# Patient Record
Sex: Male | Born: 1980 | Race: Black or African American | Hispanic: No | Smoking: Never smoker
Health system: Southern US, Community
[De-identification: ages and names within clinical notes are randomized; demographics above are authoritative.]

## PROBLEM LIST (undated history)

## (undated) DIAGNOSIS — N200 Calculus of kidney: Secondary | ICD-10-CM

---
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
## 1998-02-28 ENCOUNTER — Ambulatory Visit (HOSPITAL_COMMUNITY): Admission: RE | Admit: 1998-02-28 | Discharge: 1998-02-28 | Payer: Self-pay | Admitting: Nephrology

## 1998-02-28 ENCOUNTER — Encounter: Payer: Self-pay | Admitting: Nephrology

## 1998-05-06 ENCOUNTER — Encounter: Admission: RE | Admit: 1998-05-06 | Discharge: 1998-06-02 | Payer: Self-pay | Admitting: Nephrology

## 1998-09-08 ENCOUNTER — Ambulatory Visit (HOSPITAL_COMMUNITY): Admission: RE | Admit: 1998-09-08 | Discharge: 1998-09-08 | Payer: Self-pay | Admitting: Nephrology

## 1998-09-08 ENCOUNTER — Encounter: Payer: Self-pay | Admitting: Nephrology

## 1998-09-27 ENCOUNTER — Ambulatory Visit (HOSPITAL_COMMUNITY): Admission: RE | Admit: 1998-09-27 | Discharge: 1998-09-27 | Payer: Self-pay | Admitting: Neurosurgery

## 1999-02-11 ENCOUNTER — Emergency Department (HOSPITAL_COMMUNITY): Admission: EM | Admit: 1999-02-11 | Discharge: 1999-02-11 | Payer: Self-pay | Admitting: Emergency Medicine

## 1999-02-11 ENCOUNTER — Encounter: Payer: Self-pay | Admitting: Emergency Medicine

## 1999-07-04 ENCOUNTER — Encounter: Admission: RE | Admit: 1999-07-04 | Discharge: 1999-07-04 | Payer: Self-pay | Admitting: Nephrology

## 1999-07-04 ENCOUNTER — Encounter: Payer: Self-pay | Admitting: Nephrology

## 1999-08-30 ENCOUNTER — Emergency Department (HOSPITAL_COMMUNITY): Admission: EM | Admit: 1999-08-30 | Discharge: 1999-08-30 | Payer: Self-pay | Admitting: Emergency Medicine

## 1999-12-11 ENCOUNTER — Emergency Department (HOSPITAL_COMMUNITY): Admission: EM | Admit: 1999-12-11 | Discharge: 1999-12-11 | Payer: Self-pay | Admitting: *Deleted

## 2000-01-26 ENCOUNTER — Emergency Department (HOSPITAL_COMMUNITY): Admission: EM | Admit: 2000-01-26 | Discharge: 2000-01-26 | Payer: Self-pay | Admitting: Emergency Medicine

## 2000-01-26 ENCOUNTER — Encounter: Payer: Self-pay | Admitting: Emergency Medicine

## 2000-03-25 ENCOUNTER — Encounter: Payer: Self-pay | Admitting: Nephrology

## 2000-03-25 ENCOUNTER — Encounter: Admission: RE | Admit: 2000-03-25 | Discharge: 2000-03-25 | Payer: Self-pay | Admitting: Nephrology

## 2000-04-26 ENCOUNTER — Ambulatory Visit (HOSPITAL_COMMUNITY): Admission: RE | Admit: 2000-04-26 | Discharge: 2000-04-26 | Payer: Self-pay | Admitting: Neurosurgery

## 2000-07-31 ENCOUNTER — Emergency Department (HOSPITAL_COMMUNITY): Admission: EM | Admit: 2000-07-31 | Discharge: 2000-07-31 | Payer: Self-pay | Admitting: Emergency Medicine

## 2000-07-31 ENCOUNTER — Encounter: Payer: Self-pay | Admitting: Emergency Medicine

## 2000-08-21 ENCOUNTER — Ambulatory Visit (HOSPITAL_COMMUNITY): Admission: RE | Admit: 2000-08-21 | Discharge: 2000-08-21 | Payer: Self-pay | Admitting: Neurosurgery

## 2000-09-12 ENCOUNTER — Encounter: Payer: Self-pay | Admitting: Emergency Medicine

## 2000-09-12 ENCOUNTER — Emergency Department (HOSPITAL_COMMUNITY): Admission: EM | Admit: 2000-09-12 | Discharge: 2000-09-12 | Payer: Self-pay | Admitting: Emergency Medicine

## 2000-09-24 ENCOUNTER — Emergency Department (HOSPITAL_COMMUNITY): Admission: EM | Admit: 2000-09-24 | Discharge: 2000-09-24 | Payer: Self-pay | Admitting: Emergency Medicine

## 2000-09-24 ENCOUNTER — Encounter: Payer: Self-pay | Admitting: Emergency Medicine

## 2000-10-09 ENCOUNTER — Emergency Department (HOSPITAL_COMMUNITY): Admission: EM | Admit: 2000-10-09 | Discharge: 2000-10-09 | Payer: Self-pay | Admitting: Emergency Medicine

## 2000-10-12 ENCOUNTER — Encounter: Payer: Self-pay | Admitting: Emergency Medicine

## 2000-10-12 ENCOUNTER — Emergency Department (HOSPITAL_COMMUNITY): Admission: EM | Admit: 2000-10-12 | Discharge: 2000-10-12 | Payer: Self-pay | Admitting: Emergency Medicine

## 2000-10-28 ENCOUNTER — Encounter: Admission: RE | Admit: 2000-10-28 | Discharge: 2000-10-28 | Payer: Self-pay | Admitting: Nephrology

## 2000-10-28 ENCOUNTER — Encounter: Payer: Self-pay | Admitting: Nephrology

## 2001-03-11 ENCOUNTER — Emergency Department (HOSPITAL_COMMUNITY): Admission: EM | Admit: 2001-03-11 | Discharge: 2001-03-11 | Payer: Self-pay | Admitting: Emergency Medicine

## 2001-03-11 ENCOUNTER — Encounter: Payer: Self-pay | Admitting: Emergency Medicine

## 2001-03-12 ENCOUNTER — Emergency Department (HOSPITAL_COMMUNITY): Admission: EM | Admit: 2001-03-12 | Discharge: 2001-03-12 | Payer: Self-pay | Admitting: Emergency Medicine

## 2001-03-14 ENCOUNTER — Emergency Department (HOSPITAL_COMMUNITY): Admission: EM | Admit: 2001-03-14 | Discharge: 2001-03-14 | Payer: Self-pay | Admitting: Emergency Medicine

## 2001-03-14 ENCOUNTER — Encounter: Payer: Self-pay | Admitting: Emergency Medicine

## 2001-03-19 ENCOUNTER — Emergency Department (HOSPITAL_COMMUNITY): Admission: EM | Admit: 2001-03-19 | Discharge: 2001-03-19 | Payer: Self-pay | Admitting: Emergency Medicine

## 2001-03-19 ENCOUNTER — Encounter: Payer: Self-pay | Admitting: Emergency Medicine

## 2001-04-05 ENCOUNTER — Emergency Department (HOSPITAL_COMMUNITY): Admission: EM | Admit: 2001-04-05 | Discharge: 2001-04-05 | Payer: Self-pay | Admitting: Emergency Medicine

## 2002-11-21 ENCOUNTER — Emergency Department (HOSPITAL_COMMUNITY): Admission: EM | Admit: 2002-11-21 | Discharge: 2002-11-21 | Payer: Self-pay | Admitting: Emergency Medicine

## 2004-09-22 ENCOUNTER — Encounter: Admission: RE | Admit: 2004-09-22 | Discharge: 2004-09-22 | Payer: Self-pay | Admitting: Internal Medicine

## 2004-12-19 ENCOUNTER — Encounter: Admission: RE | Admit: 2004-12-19 | Discharge: 2004-12-19 | Payer: Self-pay | Admitting: Internal Medicine

## 2005-03-27 ENCOUNTER — Emergency Department (HOSPITAL_COMMUNITY): Admission: EM | Admit: 2005-03-27 | Discharge: 2005-03-27 | Payer: Self-pay | Admitting: Emergency Medicine

## 2006-06-22 ENCOUNTER — Emergency Department (HOSPITAL_COMMUNITY): Admission: EM | Admit: 2006-06-22 | Discharge: 2006-06-22 | Payer: Self-pay | Admitting: Emergency Medicine

## 2007-11-20 ENCOUNTER — Emergency Department (HOSPITAL_COMMUNITY): Admission: EM | Admit: 2007-11-20 | Discharge: 2007-11-20 | Payer: Self-pay | Admitting: Family Medicine

## 2007-12-05 ENCOUNTER — Encounter: Admission: RE | Admit: 2007-12-05 | Discharge: 2007-12-05 | Payer: Self-pay | Admitting: Internal Medicine

## 2008-04-14 ENCOUNTER — Ambulatory Visit: Payer: Self-pay | Admitting: Pulmonary Disease

## 2008-04-14 DIAGNOSIS — R05 Cough: Secondary | ICD-10-CM

## 2008-04-14 DIAGNOSIS — R519 Headache, unspecified: Secondary | ICD-10-CM | POA: Insufficient documentation

## 2008-04-14 DIAGNOSIS — R059 Cough, unspecified: Secondary | ICD-10-CM | POA: Insufficient documentation

## 2008-04-14 DIAGNOSIS — R51 Headache: Secondary | ICD-10-CM

## 2008-04-14 DIAGNOSIS — J309 Allergic rhinitis, unspecified: Secondary | ICD-10-CM | POA: Insufficient documentation

## 2008-07-19 ENCOUNTER — Emergency Department (HOSPITAL_COMMUNITY): Admission: EM | Admit: 2008-07-19 | Discharge: 2008-07-19 | Payer: Self-pay | Admitting: Emergency Medicine

## 2008-09-06 ENCOUNTER — Emergency Department (HOSPITAL_COMMUNITY): Admission: EM | Admit: 2008-09-06 | Discharge: 2008-09-06 | Payer: Self-pay | Admitting: Emergency Medicine

## 2009-08-16 ENCOUNTER — Emergency Department (HOSPITAL_COMMUNITY): Admission: EM | Admit: 2009-08-16 | Discharge: 2009-08-16 | Payer: Self-pay | Admitting: Internal Medicine

## 2010-03-15 ENCOUNTER — Emergency Department (HOSPITAL_COMMUNITY)
Admission: EM | Admit: 2010-03-15 | Discharge: 2010-03-15 | Payer: Self-pay | Source: Home / Self Care | Admitting: Family Medicine

## 2010-04-10 ENCOUNTER — Encounter: Payer: Self-pay | Admitting: Internal Medicine

## 2010-04-12 ENCOUNTER — Emergency Department (HOSPITAL_COMMUNITY)
Admission: EM | Admit: 2010-04-12 | Discharge: 2010-04-12 | Payer: Self-pay | Source: Home / Self Care | Admitting: Emergency Medicine

## 2010-04-12 LAB — URINE MICROSCOPIC-ADD ON

## 2010-04-12 LAB — URINALYSIS, ROUTINE W REFLEX MICROSCOPIC
Bilirubin Urine: NEGATIVE
Ketones, ur: NEGATIVE mg/dL
Protein, ur: 30 mg/dL — AB
Urine Glucose, Fasting: NEGATIVE mg/dL
pH: 6 (ref 5.0–8.0)

## 2010-05-29 LAB — POCT I-STAT, CHEM 8
BUN: 15 mg/dL (ref 6–23)
Calcium, Ion: 1.12 mmol/L (ref 1.12–1.32)
Chloride: 105 mEq/L (ref 96–112)
Glucose, Bld: 95 mg/dL (ref 70–99)
HCT: 49 % (ref 39.0–52.0)
Hemoglobin: 16.7 g/dL (ref 13.0–17.0)
Potassium: 4.3 mEq/L (ref 3.5–5.1)

## 2010-06-05 LAB — URINALYSIS, ROUTINE W REFLEX MICROSCOPIC
Glucose, UA: NEGATIVE mg/dL
Ketones, ur: NEGATIVE mg/dL
Specific Gravity, Urine: 1.019 (ref 1.005–1.030)
pH: 5 (ref 5.0–8.0)

## 2010-06-05 LAB — POCT I-STAT, CHEM 8
Calcium, Ion: 1.08 mmol/L — ABNORMAL LOW (ref 1.12–1.32)
Chloride: 103 mEq/L (ref 96–112)
HCT: 48 % (ref 39.0–52.0)
Hemoglobin: 16.3 g/dL (ref 13.0–17.0)
Potassium: 4.2 mEq/L (ref 3.5–5.1)
Sodium: 137 mEq/L (ref 135–145)
TCO2: 27 mmol/L (ref 0–100)

## 2010-09-24 ENCOUNTER — Emergency Department (HOSPITAL_COMMUNITY)
Admission: EM | Admit: 2010-09-24 | Discharge: 2010-09-24 | Disposition: A | Discharge status: Home / Self Care | Payer: Self-pay | Attending: Emergency Medicine | Admitting: Emergency Medicine

## 2010-09-24 DIAGNOSIS — E860 Dehydration: Secondary | ICD-10-CM | POA: Insufficient documentation

## 2010-09-24 DIAGNOSIS — R42 Dizziness and giddiness: Secondary | ICD-10-CM | POA: Insufficient documentation

## 2010-09-24 DIAGNOSIS — N23 Unspecified renal colic: Secondary | ICD-10-CM | POA: Insufficient documentation

## 2010-09-24 LAB — POCT I-STAT, CHEM 8
BUN: 8 mg/dL (ref 6–23)
Calcium, Ion: 1.16 mmol/L (ref 1.12–1.32)
Chloride: 104 meq/L (ref 96–112)
Creatinine, Ser: 1 mg/dL (ref 0.50–1.35)
Glucose, Bld: 86 mg/dL (ref 70–99)
HCT: 51 % (ref 39.0–52.0)
Hemoglobin: 17.3 g/dL — ABNORMAL HIGH (ref 13.0–17.0)
Potassium: 3.9 meq/L (ref 3.5–5.1)
Sodium: 141 meq/L (ref 135–145)
TCO2: 26 mmol/L (ref 0–100)

## 2010-09-24 LAB — URINE MICROSCOPIC-ADD ON

## 2010-09-24 LAB — URINALYSIS, ROUTINE W REFLEX MICROSCOPIC
Bilirubin Urine: NEGATIVE
Glucose, UA: NEGATIVE mg/dL
Ketones, ur: NEGATIVE mg/dL
Leukocytes, UA: NEGATIVE
Nitrite: NEGATIVE
Protein, ur: NEGATIVE mg/dL
Specific Gravity, Urine: 1.027 (ref 1.005–1.030)
Urobilinogen, UA: 0.2 mg/dL (ref 0.0–1.0)
pH: 5.5 (ref 5.0–8.0)

## 2010-09-25 ENCOUNTER — Other Ambulatory Visit: Payer: Self-pay

## 2010-09-25 LAB — OCCULT BLOOD, POC DEVICE: Fecal Occult Bld: POSITIVE

## 2010-12-20 LAB — POCT URINALYSIS DIP (DEVICE)
Bilirubin Urine: NEGATIVE
Glucose, UA: NEGATIVE
Nitrite: NEGATIVE
Specific Gravity, Urine: 1.02
Urobilinogen, UA: 0.2

## 2011-05-13 ENCOUNTER — Emergency Department (HOSPITAL_COMMUNITY)
Admission: EM | Admit: 2011-05-13 | Discharge: 2011-05-13 | Disposition: A | Discharge status: Home Health | Payer: Self-pay | Attending: Emergency Medicine | Admitting: Emergency Medicine

## 2011-05-13 ENCOUNTER — Encounter (HOSPITAL_COMMUNITY): Payer: Self-pay | Admitting: *Deleted

## 2011-05-13 ENCOUNTER — Emergency Department (HOSPITAL_COMMUNITY): Payer: Self-pay

## 2011-05-13 DIAGNOSIS — M545 Low back pain, unspecified: Secondary | ICD-10-CM | POA: Insufficient documentation

## 2011-05-13 DIAGNOSIS — R109 Unspecified abdominal pain: Secondary | ICD-10-CM | POA: Insufficient documentation

## 2011-05-13 DIAGNOSIS — R1032 Left lower quadrant pain: Secondary | ICD-10-CM | POA: Insufficient documentation

## 2011-05-13 HISTORY — DX: Calculus of kidney: N20.0

## 2011-05-13 LAB — POCT I-STAT, CHEM 8
BUN: 13 mg/dL (ref 6–23)
Calcium, Ion: 1.16 mmol/L (ref 1.12–1.32)
Hemoglobin: 16 g/dL (ref 13.0–17.0)
Sodium: 141 mEq/L (ref 135–145)
TCO2: 26 mmol/L (ref 0–100)

## 2011-05-13 LAB — URINALYSIS, ROUTINE W REFLEX MICROSCOPIC
Bilirubin Urine: NEGATIVE
Glucose, UA: NEGATIVE mg/dL
Hgb urine dipstick: NEGATIVE
Ketones, ur: NEGATIVE mg/dL
Protein, ur: NEGATIVE mg/dL
Urobilinogen, UA: 0.2 mg/dL (ref 0.0–1.0)

## 2011-05-13 LAB — CBC
HCT: 44.4 % (ref 39.0–52.0)
MCH: 27.5 pg (ref 26.0–34.0)
MCHC: 34 g/dL (ref 30.0–36.0)
MCV: 80.7 fL (ref 78.0–100.0)
Platelets: 235 10*3/uL (ref 150–400)
RDW: 12.9 % (ref 11.5–15.5)

## 2011-05-13 LAB — DIFFERENTIAL
Basophils Absolute: 0 10*3/uL (ref 0.0–0.1)
Eosinophils Absolute: 0.1 10*3/uL (ref 0.0–0.7)
Eosinophils Relative: 2 % (ref 0–5)
Lymphocytes Relative: 48 % — ABNORMAL HIGH (ref 12–46)
Monocytes Absolute: 0.3 10*3/uL (ref 0.1–1.0)

## 2011-05-13 MED ORDER — KETOROLAC TROMETHAMINE 30 MG/ML IJ SOLN
30.0000 mg | Freq: Once | INTRAMUSCULAR | Status: AC
  Administered 2011-05-13: 30 mg via INTRAVENOUS
  Filled 2011-05-13: qty 1

## 2011-05-13 MED ORDER — HYDROCODONE-ACETAMINOPHEN 5-500 MG PO TABS: 1.0000 | ORAL_TABLET | Freq: Four times a day (QID) | ORAL | Status: AC | PRN

## 2011-05-13 NOTE — ED Notes (Signed)
Pt reports bilateral lower abd pain since yesterday, denies any n/v/d or urinary symptoms. No distress noted at triage.

## 2011-05-13 NOTE — ED Provider Notes (Signed)
History     CSN: [id number]  Arrival date & time 05/13/11  1615   First MD Initiated Contact with Patient 05/13/11 1856      Chief Complaint  Patient presents with  . Abdominal Pain    (Consider location/radiation/quality/duration/timing/severity/associated sxs/prior treatment) HPI Hx provided by the pt.  31 y.o. M with h/o kidney stones presenting with c/o abd pain.  Pt started gradually about 3 days ago and is described as constant, sharp, in bilateral lower abd but now primarily in the LLQ.  Occasional radiation to his lower back.  Mildly worse when prone; no appreciated alleviating factors.  Pain is a 5-6/10 in severity and is somewhat similar to his prior kidney stone pain but different 2/2 located bilaterally now.  No associated N/V/D, hematochezia, fever, or urinary c/o.  Pt has not been seen recently for these complaints.  All of pt's prior kidney stones have passed spontaneously and without intervention.    Past Medical History  Diagnosis Date  . Kidney stone     History reviewed. No pertinent past surgical history.  History reviewed. No pertinent family history.  History  Substance Use Topics  . Smoking status: Not on file  . Smokeless tobacco: Not on file  . Alcohol Use: No      Review of Systems  Constitutional: Negative for fever and chills.  HENT: Negative for congestion, sore throat and rhinorrhea.   Eyes: Negative for pain and visual disturbance.  Respiratory: Negative for cough and shortness of breath.   Cardiovascular: Negative for chest pain and palpitations.  Gastrointestinal: Positive for abdominal pain. Negative for nausea, vomiting and diarrhea.  Genitourinary: Negative for dysuria, hematuria, scrotal swelling and testicular pain.  Musculoskeletal: Negative for back pain and gait problem.  Skin: Negative for rash and wound.  Neurological: Negative for dizziness and headaches.  Psychiatric/Behavioral: Negative for confusion and agitation.    All other systems reviewed and are negative.    Allergies  Review of patient's allergies indicates no known allergies.  Home Medications  No current outpatient prescriptions on file.  BP 123/83  Pulse 82  Temp(Src) 98.1 F (36.7 C) (Oral)  Resp 18  SpO2 100%  Physical Exam  Nursing note and vitals reviewed. Constitutional: He is oriented to person, place, and time. He appears well-developed and well-nourished. No distress.  HENT:  Head: Normocephalic and atraumatic.  Right Ear: External ear normal.  Left Ear: External ear normal.  Nose: Nose normal.  Mouth/Throat: Oropharynx is clear and moist.  Eyes: Conjunctivae and EOM are normal. Pupils are equal, round, and reactive to light.  Neck: Normal range of motion. Neck supple.  Cardiovascular: Normal rate, regular rhythm and intact distal pulses.   No murmur heard. Pulmonary/Chest: Effort normal and breath sounds normal. No respiratory distress.  Abdominal: Soft. Bowel sounds are normal. He exhibits no distension. There is no tenderness. There is no rebound and no CVA tenderness.  Genitourinary: Prostate normal. Guaiac negative stool.       No penile d/c; no testicular pain or edema; equal/NL cremaster reflexes.  Musculoskeletal: Normal range of motion. He exhibits no edema.  Neurological: He is alert and oriented to person, place, and time.  Skin: Skin is warm and dry. No rash noted. He is not diaphoretic.  Psychiatric: Judgment normal. His affect is blunt.    ED Course  Procedures (including critical care time)  Labs Reviewed  DIFFERENTIAL - Abnormal; Notable for the following:    Lymphocytes Relative 48 (*)    All  other components within normal limits  URINALYSIS, ROUTINE W REFLEX MICROSCOPIC  CBC  POCT I-STAT, CHEM 8   Dg Abd 1 View  05/13/2011  *RADIOLOGY REPORT*  Clinical Data: History of kidney stones with lower abdominal pain.  ABDOMEN - 1 VIEW  Comparison: Abdominal CT 08/16/2009  Findings: There is a  nonobstructive bowel gas pattern.  There is a small amount of stool in the right colon.  No large abdominal calcifications.  Limited evaluation for small kidney stones due to overlying bowel gas.  No large calcifications in the pelvis.  IMPRESSION: Nonspecific bowel gas pattern.  No large abdominal calcifications as described.  Original Report Authenticated By: Richarda Overlie, M.D.     1. Abdominal pain      MDM  31 y.o. M with h/o ureterolithiasis wihtout prior interventions with 3 days of constant bilateral lower abd pain, now more on the Lt, somewhat similar to prior stones.  Exam as above, AF/VSS, well-appearing, benign abd and no CVA TTP; no Sn of testicular torsion or appy.  Doubt diverticulitis.  Ureterolithiasis possible, although atypical presentation and UA without blood.  UA also neg for UTI; NL WBC and creat.  KUB without large stone.  Will provide pain mgmt Rx and have pt strain his urine.  Return precautions reviewed.          Particia Lather, MD 05/14/11 [phone]

## 2011-05-16 NOTE — ED Provider Notes (Signed)
I saw and evaluated the patient, reviewed the resident's note and I agree with the findings and plan. Abdominal pain like previous kidney stones. X-ray is reassuring. No hematuria. Patient feels much better. Doubt diverticulitis testicular torsion or appendicitis. He was given pain meds and will followup as needed. Good return precautions were provided  Juliet Rude. Rubin Payor, MD 05/16/11 [phone]

## 2011-06-06 ENCOUNTER — Other Ambulatory Visit (HOSPITAL_COMMUNITY): Payer: Self-pay | Admitting: Urology

## 2011-06-08 ENCOUNTER — Ambulatory Visit (HOSPITAL_COMMUNITY)
Admission: RE | Admit: 2011-06-08 | Discharge: 2011-06-08 | Disposition: A | Discharge status: Home / Self Care | Payer: Self-pay | Source: Ambulatory Visit | Attending: Urology | Admitting: Urology

## 2011-06-08 DIAGNOSIS — R319 Hematuria, unspecified: Secondary | ICD-10-CM | POA: Insufficient documentation

## 2011-06-08 DIAGNOSIS — N200 Calculus of kidney: Secondary | ICD-10-CM | POA: Insufficient documentation

## 2011-06-08 MED ORDER — IOHEXOL 300 MG/ML  SOLN
150.0000 mL | Freq: Once | INTRAMUSCULAR | Status: AC | PRN
  Administered 2011-06-08: 150 mL via INTRAVENOUS

## 2012-04-25 ENCOUNTER — Emergency Department (HOSPITAL_COMMUNITY): Payer: Self-pay

## 2012-04-25 ENCOUNTER — Encounter (HOSPITAL_COMMUNITY): Payer: Self-pay

## 2012-04-25 ENCOUNTER — Emergency Department (HOSPITAL_COMMUNITY)
Admission: EM | Admit: 2012-04-25 | Discharge: 2012-04-25 | Disposition: A | Discharge status: Home / Self Care | Payer: Self-pay | Attending: Emergency Medicine | Admitting: Emergency Medicine

## 2012-04-25 DIAGNOSIS — Z87442 Personal history of urinary calculi: Secondary | ICD-10-CM | POA: Insufficient documentation

## 2012-04-25 DIAGNOSIS — R079 Chest pain, unspecified: Secondary | ICD-10-CM | POA: Insufficient documentation

## 2012-04-25 LAB — CBC WITH DIFFERENTIAL/PLATELET
Basophils Absolute: 0 10*3/uL (ref 0.0–0.1)
Eosinophils Absolute: 0.1 10*3/uL (ref 0.0–0.7)
Eosinophils Relative: 3 % (ref 0–5)
Lymphocytes Relative: 47 % — ABNORMAL HIGH (ref 12–46)
Lymphs Abs: 1.7 10*3/uL (ref 0.7–4.0)
MCH: 28.3 pg (ref 26.0–34.0)
MCV: 81.7 fL (ref 78.0–100.0)
Neutrophils Relative %: 45 % (ref 43–77)
Platelets: 253 10*3/uL (ref 150–400)
RBC: 5.58 MIL/uL (ref 4.22–5.81)
RDW: 13 % (ref 11.5–15.5)
WBC: 3.7 10*3/uL — ABNORMAL LOW (ref 4.0–10.5)

## 2012-04-25 LAB — BASIC METABOLIC PANEL
Calcium: 9.5 mg/dL (ref 8.4–10.5)
GFR calc Af Amer: >90 mL/min (ref >90)
GFR calc non Af Amer: >90 mL/min (ref >90)
Potassium: 4.4 mEq/L (ref 3.5–5.1)
Sodium: 137 mEq/L (ref 135–145)

## 2012-04-25 LAB — POCT I-STAT TROPONIN I: Troponin i, poc: 0.01 ng/mL (ref 0.00–0.08)

## 2012-04-25 NOTE — ED Provider Notes (Signed)
History     CSN: [id number]  Arrival date & time 04/25/12  1323   First MD Initiated Contact with Patient 04/25/12 1414      No chief complaint on file.   (Consider location/radiation/quality/duration/timing/severity/associated sxs/prior treatment) HPI Comments: Patient is a 32 year old with no significant past medical history who presents with a 3 day history of chest pain. Patient reports gradual onset of left chest pressure that progressively worsened since the onset. The pressure did not radiate. The patient denies associated nausea, dizziness, and diaphoresis. The pain has been constant for the past 3 days. He has not taken anything for pain. No aggravating/allevaiting factors. Patient denies fever, headache, visual changes, vomiting, diarrhea, abdominal pain, numbness/tingling. Patient is not a smoker. No history of blood clot or DVT or recent travel. No family history of heart disease.     Past Medical History  Diagnosis Date  . Kidney stone     History reviewed. No pertinent past surgical history.  No family history on file.  History  Substance Use Topics  . Smoking status: Not on file  . Smokeless tobacco: Not on file  . Alcohol Use: No      Review of Systems  Cardiovascular: Positive for chest pain.  All other systems reviewed and are negative.    Allergies  Review of patient's allergies indicates no known allergies.  Home Medications  No current outpatient prescriptions on file.  BP 122/81  Pulse 77  Temp 98.3 F (36.8 C) (Oral)  Resp 18  SpO2 99%  Physical Exam  Nursing note and vitals reviewed. Constitutional: He is oriented to person, place, and time. He appears well-developed and well-nourished. No distress.  HENT:  Head: Normocephalic and atraumatic.  Eyes: Conjunctivae normal and EOM are normal.  Neck: Normal range of motion.  Cardiovascular: Normal rate and regular rhythm.  Exam reveals no gallop and no friction rub.   No murmur  heard. Pulmonary/Chest: Effort normal and breath sounds normal. He has no wheezes. He has no rales. He exhibits no tenderness.  Abdominal: Soft. He exhibits no distension. There is no tenderness. There is no rebound and no guarding.  Musculoskeletal: Normal range of motion.  Neurological: He is alert and oriented to person, place, and time. Coordination normal.       Speech is goal-oriented. Moves limbs without ataxia.   Skin: Skin is warm and dry.  Psychiatric: He has a normal mood and affect. His behavior is normal.    ED Course  Procedures (including critical care time)   Date: 04/25/2012  Rate: 77  Rhythm: normal sinus rhythm  QRS Axis: normal  Intervals: normal  ST/T Wave abnormalities: normal  Conduction Disutrbances:none  Narrative Interpretation: NSR with no previous for comparison  Old EKG Reviewed: unchanged    Labs Reviewed  CBC WITH DIFFERENTIAL - Abnormal; Notable for the following:    WBC 3.7 (*)     Lymphocytes Relative 47 (*)     All other components within normal limits  BASIC METABOLIC PANEL  POCT I-STAT TROPONIN I   Dg Chest 2 View  04/25/2012  *RADIOLOGY REPORT*  Clinical Data:  Chest pain.  CHEST - 2 VIEW  Comparison: 07/19/2008  Findings: The heart size and mediastinal contours are within normal limits.  Both lungs are clear.  The visualized skeletal structures are unremarkable.  IMPRESSION: No active disease.   Original Report Authenticated By: Irish Lack, M.D.      1. Chest pain  MDM  3:07 PM Labs unremarkable. Troponin negative. EKG unremarkable. Chest xray unremarkable. Patient is PERC negative. Declines pain medication. Vitals stable. Patient can be discharged with PCP follow up. Return precautions given.         Emilia Beck, PA-C 04/25/12 1557

## 2012-04-25 NOTE — ED Notes (Signed)
Pt presents with 3 day h/o L sided chest pain.  Pt reports pain has been constant but will lessen, does not radiate, pt denies any particular injury.  Pt reports pain to back of neck; denies any shortness of breath or nausea.

## 2012-04-25 NOTE — ED Provider Notes (Signed)
Medical screening examination/treatment/procedure(s) were performed by non-physician practitioner and as supervising physician I was immediately available for consultation/collaboration.   Charles B. Sheldon, MD 04/25/12 2237 

## 2012-08-14 IMAGING — CT CT ABD-PEL WO/W CM
2 of 7 series · 14 of 46 positions shown, 19 images · IV contrast (agent unspecified)
Comparison: 05/13/2011 plain film.  08/16/2009 stone study.

CLINICAL DATA: Painless hematuria.  History of kidney stones.

CT ABDOMEN AND PELVIS WITHOUT AND WITH CONTRAST
TECHNIQUE: Multidetector CT imaging of the abdomen and pelvis was
performed without contrast material in one or both body regions,
followed by contrast material(s) and further sections in one or
both body regions.
Contrast:  150 ml Mmnipaque-ULL

[Series 2: pre · axial · non-contrast · 0.80mm/px · z∈[-512,-116]mm · 11 of 95 slices shown, 16 images]
[im 8/95  soft-tissue]
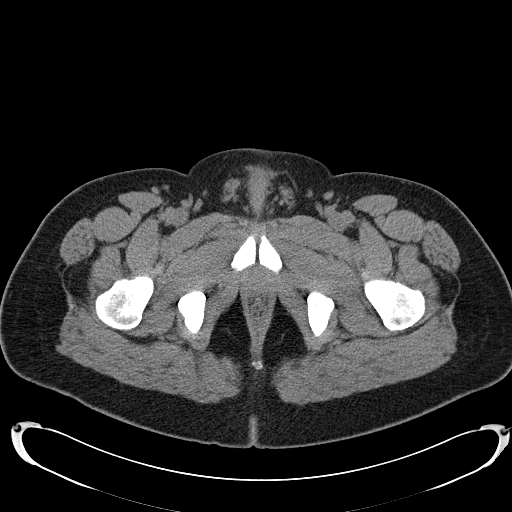
[im 8/95  bone]
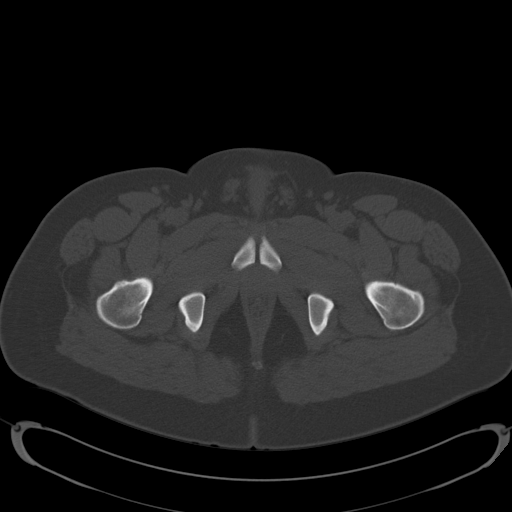
[im 15/95  soft-tissue]
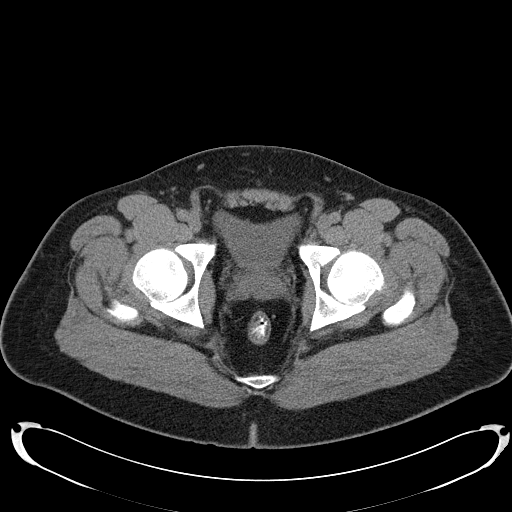
[im 29/95  soft-tissue]
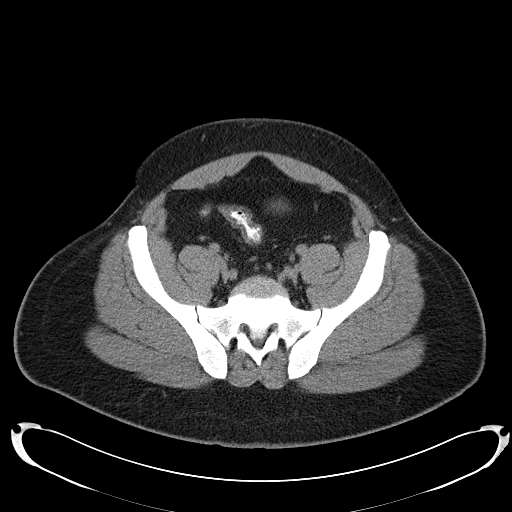
[im 37/95  soft-tissue]
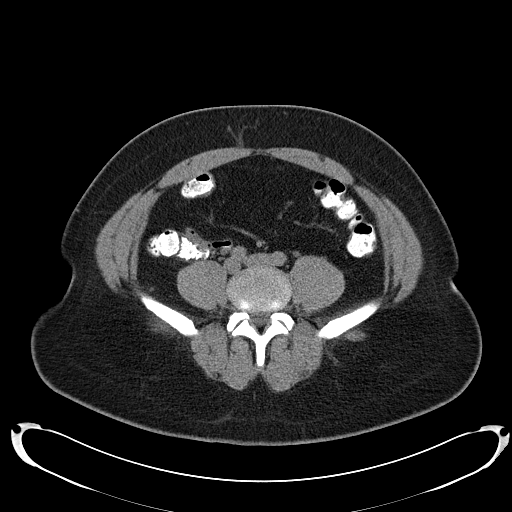
[im 44/95  soft-tissue]
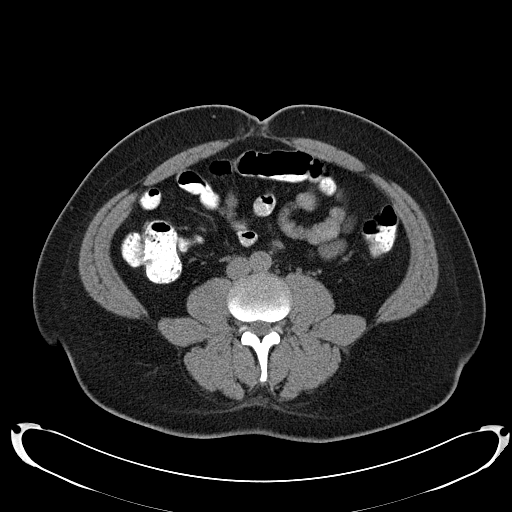
[im 51/95  soft-tissue]
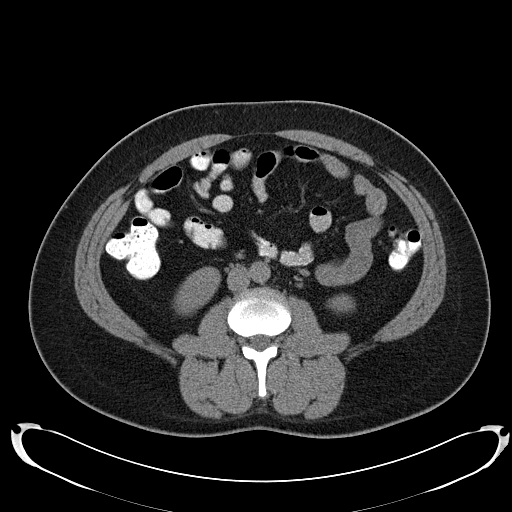
[im 58/95  soft-tissue]
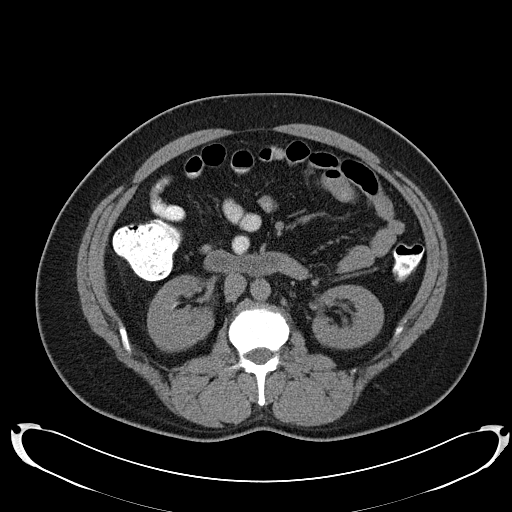
[im 66/95  lung]
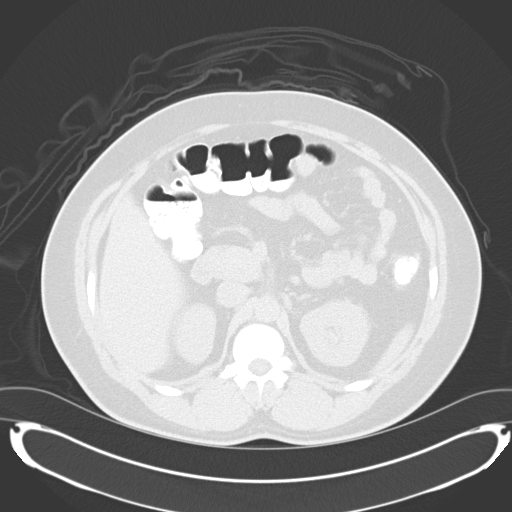
[im 73/95  soft-tissue]
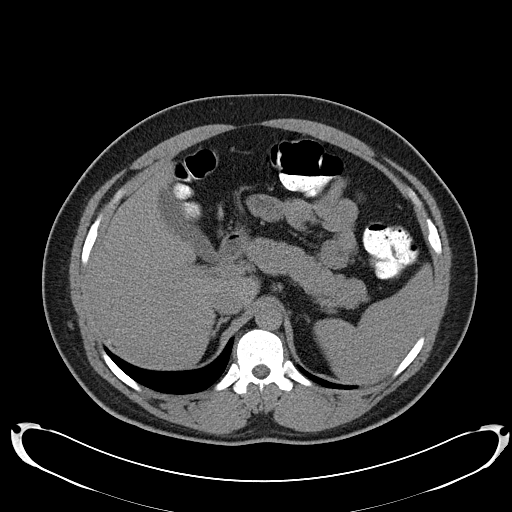
[im 73/95  lung]
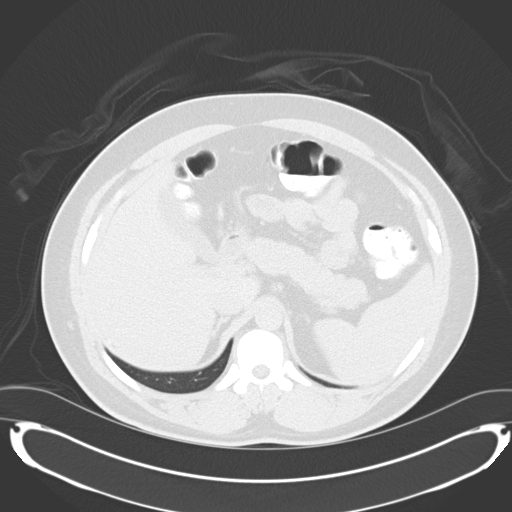
[im 80/95  soft-tissue]
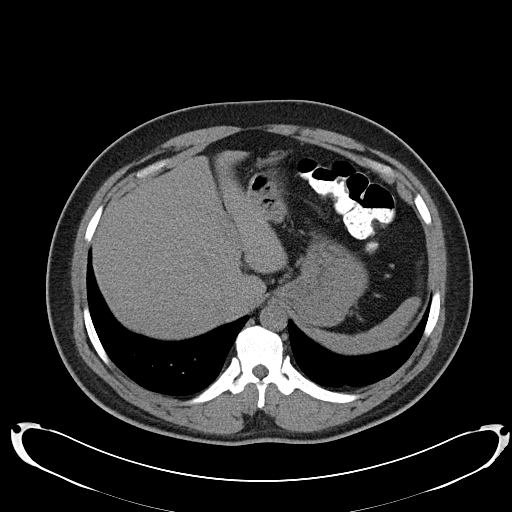
[im 80/95  lung]
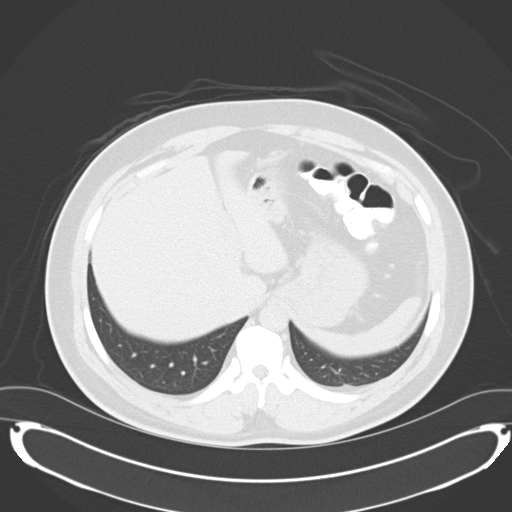
[im 80/95  bone]
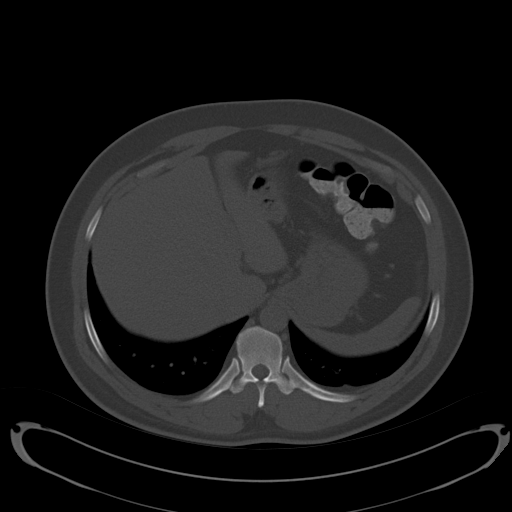
[im 87/95  soft-tissue]
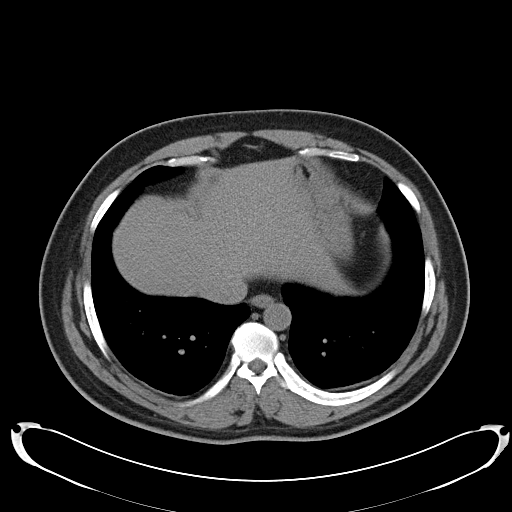
[im 87/95  lung]
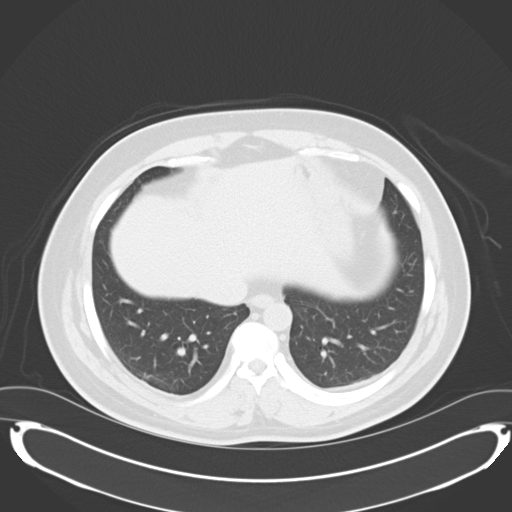

[Series 603: w/o coronal · coronal · non-contrast · 0.93mm/px · 3 of 89 slices shown]
[im 23/89  soft-tissue]
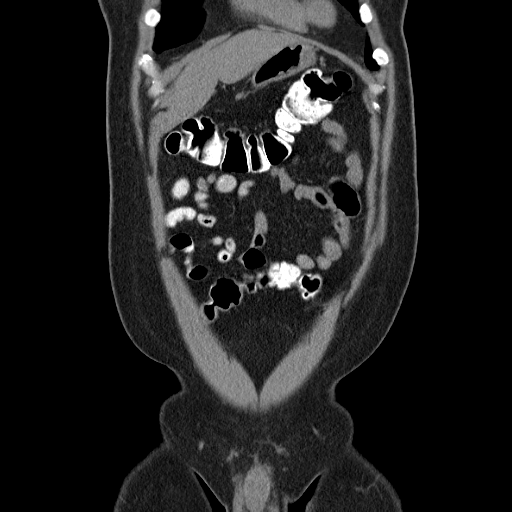
[im 45/89  soft-tissue]
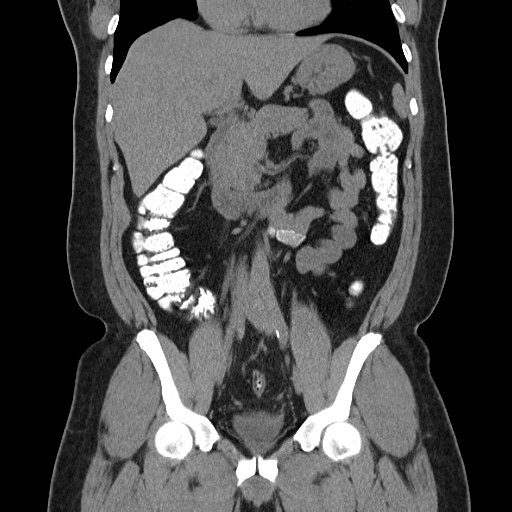
[im 67/89  soft-tissue]
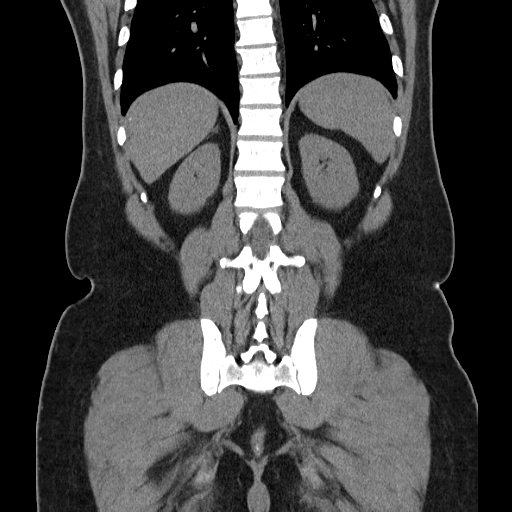

[14 of 46 positions shown; findings below may reference images not displayed]

FINDINGS: Unenhanced images demonstrate 4 mm lower pole right renal
collecting system calculus, not significantly changed. A smaller
stone in the lower pole right renal collecting system is only
readily apparent on coronal reformats.

No hydronephrosis.  No hydroureter or ureteric calculi.

Post contrast images demonstrate clear lung bases.  Heart size
upper normal, without pericardial or pleural effusion.

Normal liver, spleen, stomach, pancreas, gallbladder, biliary
tract, adrenal glands.  Otherwise normal appearance of the kidneys.
The collecting systems are moderately well opacified.  The ureters
are well opacified throughout. No filling defect identified.
Suspect a circumaortic left renal vein. No retroperitoneal or
retrocrural adenopathy.

Normal colon, appendix, and terminal ileum.  Normal small bowel
without abdominal ascites.

  No pelvic adenopathy.  Normal urinary bladder.  Moderately well
opacified.  Normal prostate, without free fluid. No acute osseous
abnormality.
IMPRESSION: Right-sided renal collecting systems stones.  No urinary tract
obstruction or other cause for hematuria

## 2016-03-13 ENCOUNTER — Encounter (HOSPITAL_COMMUNITY): Payer: Self-pay | Admitting: Emergency Medicine

## 2016-03-13 ENCOUNTER — Ambulatory Visit (HOSPITAL_COMMUNITY)
Admission: EM | Admit: 2016-03-13 | Discharge: 2016-03-13 | Disposition: A | Discharge status: Home / Self Care | Payer: BLUE CROSS/BLUE SHIELD | Attending: Emergency Medicine | Admitting: Emergency Medicine

## 2016-03-13 DIAGNOSIS — J029 Acute pharyngitis, unspecified: Secondary | ICD-10-CM | POA: Diagnosis not present

## 2016-03-13 LAB — POCT RAPID STREP A: STREPTOCOCCUS, GROUP A SCREEN (DIRECT): NEGATIVE

## 2016-03-13 NOTE — ED Triage Notes (Signed)
The patient presented to the [facility] with a complaint of a sore throat x 2 days. The patient denied any fever.  

## 2016-03-13 NOTE — Discharge Instructions (Signed)
You tested negative for strep throat. You may use over the counter therapies such as tylenol or chloraseptic throat spray for pain relief. Should symptoms fail to improve follow up with your primary care provider or return to clinic.

## 2016-03-13 NOTE — ED Provider Notes (Signed)
CSN: [id number]     Arrival date & time 03/13/16  1932 History   First MD Initiated Contact with Patient 03/13/16 1948     Chief Complaint  Patient presents with  . Sore Throat   (Consider location/radiation/quality/duration/timing/severity/associated sxs/prior Treatment) 35 year old male patient presents with 12 hour chief complaint of sore throat. Patient reports his symptoms started yesterday after a long distance flight. Patient denies n/v/d, body aches, cough, or congestion. He reports he has tried honey and OTC throat lozenges for symptoms without relief. He reports he has had fever and chills today as well and a sensation his throat was narrowing.   The history is provided by the patient.  Sore Throat  Pertinent negatives include no chest pain, no abdominal pain and no shortness of breath.    Past Medical History:  Diagnosis Date  . Kidney stone    History reviewed. No pertinent surgical history. History reviewed. No pertinent family history. Social History  Substance Use Topics  . Smoking status: Not on file  . Smokeless tobacco: Not on file  . Alcohol use No    Review of Systems  Constitutional: Positive for chills and fever. Negative for diaphoresis and fatigue.  HENT: Positive for sore throat. Negative for congestion, ear discharge, ear pain, facial swelling, postnasal drip and sinus pressure.   Respiratory: Negative for cough, shortness of breath and wheezing.   Cardiovascular: Negative for chest pain.  Gastrointestinal: Negative for abdominal pain, diarrhea, nausea and vomiting.  Musculoskeletal: Negative for arthralgias and myalgias.  Neurological: Negative for dizziness, weakness, light-headedness and numbness.    Allergies  Patient has no known allergies.  Home Medications   Prior to Admission medications   Not on File   Meds Ordered and Administered this Visit  Medications - No data to display  BP 142/84 (BP Location: Right Arm)   Pulse 112   Temp  99.8 F (37.7 C) (Oral)   Resp 16   SpO2 98%  No data found.   Physical Exam  Constitutional: He is oriented to person, place, and time. He appears well-developed and well-nourished. No distress.  HENT:  Head: Normocephalic.  Right Ear: External ear normal.  Left Ear: External ear normal.  Nose: Nose normal.  Mouth/Throat: Mucous membranes are normal. No oropharyngeal exudate, posterior oropharyngeal edema or posterior oropharyngeal erythema. Tonsils are 3+ on the right. Tonsils are 3+ on the left.  Eyes: Pupils are equal, round, and reactive to light. Right eye exhibits no discharge. Left eye exhibits no discharge.  Neck: Normal range of motion. No JVD present.  Cardiovascular: Normal rate and regular rhythm.   Pulmonary/Chest: Effort normal.  Abdominal: Soft. Bowel sounds are normal.  Lymphadenopathy:    He has cervical adenopathy.  Neurological: He is alert and oriented to person, place, and time.  Skin: Skin is warm and dry. Capillary refill takes less than 2 seconds. He is not diaphoretic.  Psychiatric: He has a normal mood and affect.  Nursing note and vitals reviewed.   Urgent Care Course   Clinical Course     Procedures (including critical care time)  Labs Review Labs Reviewed  POCT RAPID STREP A    Imaging Review No results found.   Visual Acuity Review  Right Eye Distance:   Left Eye Distance:   Bilateral Distance:    Right Eye Near:   Left Eye Near:    Bilateral Near:         MDM   1. Sore throat  The patient tested negative for strep throat. He may use OTC tylenol or chloraseptic throat spray for pain relief. Should symptoms fail to improve or worsen follow up with PCP or return to clinic.     Dorena BodoLawrence Greer Koeppen, NP 03/13/16 2020

## 2016-03-16 LAB — CULTURE, GROUP A STREP (THRC)

## 2017-08-23 ENCOUNTER — Other Ambulatory Visit: Payer: Self-pay

## 2017-08-23 ENCOUNTER — Ambulatory Visit (HOSPITAL_COMMUNITY)
Admission: EM | Admit: 2017-08-23 | Discharge: 2017-08-23 | Disposition: A | Discharge status: Home / Self Care | Payer: BLUE CROSS/BLUE SHIELD | Attending: Family Medicine | Admitting: Family Medicine

## 2017-08-23 ENCOUNTER — Encounter (HOSPITAL_COMMUNITY): Payer: Self-pay | Admitting: Emergency Medicine

## 2017-08-23 DIAGNOSIS — Z760 Encounter for issue of repeat prescription: Secondary | ICD-10-CM

## 2017-08-23 NOTE — ED Triage Notes (Signed)
Pt here for refill of Zoloft.  Pt states he ran out one month ago and has not established care with a PCP yet.

## 2017-08-23 NOTE — ED Provider Notes (Signed)
[facility]    CSN: [id number] Arrival date & time: 08/23/17  1312     History   Chief Complaint Chief Complaint  Patient presents with  . Medication Refill    HPI Iban Utz is a 37 y.o. male.   Khaiden presents with requests for medication refill. 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 He moves due to work. States he ran out of the medication 3 weeks ago. He is here in [place] for approximately 2 months, but hopes to establish with a PCP in [place] where he is usually. Denies any other previous medical history, does not take any other medications.    ROS per HPI.      History reviewed. No pertinent past medical history.  Patient Active Problem List   Diagnosis Date Noted  . ALLERGIC RHINITIS 04/14/2008  . HEADACHE, CHRONIC 04/14/2008  . COUGH 04/14/2008    History reviewed. No pertinent surgical history.     Home Medications    Prior to Admission medications   Medication Sig Start Date End Date Taking? Authorizing Provider  sertraline (ZOLOFT) 50 MG tablet TK 1 T PO QD 08/21/17   [provider]    Family History History reviewed. No pertinent family history.  Social History Social History   Tobacco Use  . Smoking status: Never Smoker  . Smokeless tobacco: Never Used  Substance Use Topics  . Alcohol use: No  . Drug use: No     Allergies   Patient has no known allergies.   Review of Systems Review of Systems   Physical Exam Triage Vital Signs ED Triage Vitals  Enc Vitals Group     BP 08/23/17 1339 (!) 137/96     Pulse Rate 08/23/17 1339 85     Resp --      Temp 08/23/17 1339 98.3 F (36.8 C)     Temp Source 08/23/17 1339 Oral     SpO2 08/23/17 1339 97 %     Weight --      Height --      Head Circumference --      Peak Flow --      Pain Score 08/23/17 1336 0     Pain Loc --      Pain Edu? --      Excl. in GC? --    No data found.  Updated Vital Signs BP (!)  137/96 (BP Location: Right Arm)   Pulse 85   Temp 98.3 F (36.8 C) (Oral)   SpO2 97%   Visual Acuity Right Eye Distance:   Left Eye Distance:   Bilateral Distance:    Right Eye Near:   Left Eye Near:    Bilateral Near:     Physical Exam  Constitutional: He is oriented to person, place, and time. He appears well-developed and well-nourished.  Cardiovascular: Normal rate and regular rhythm.  Pulmonary/Chest: Effort normal and breath sounds normal.  Neurological: He is alert and oriented to person, place, and time.  Skin: Skin is warm and dry.     UC Treatments / Results  Labs (all labs ordered are listed, but only abnormal results are displayed) Labs Reviewed - No data to display  EKG None  Radiology No results found.  Procedures Procedures (including critical care time)  Medications Ordered in UC Medications - No data to display  Initial Impression / Assessment and Plan / UC Course  I have reviewed the triage vital signs  and the nursing notes.  Pertinent labs & imaging results that were available during my care of the patient were reviewed by me and considered in my medical decision making (see chart for details).     [facility] called, they said that patient picked up script for 30tabs of 50mg  zoloft two days ago in [place]. Brought this up to patient who then said he left the medicine in [place]. Concern as this is change to initial story, as he states he ran out 3 weeks ago and did not have a prescribing physician. At this time recommended to call prescribing physician to discuss this as they may be more willing to refill. Encouraged establish with PCP. Patient verbalized understanding and agreeable to plan.  Ambulatory out of clinic without difficulty.    Final Clinical Impressions(s) / UC Diagnoses   Final diagnoses:  Encounter for medication refill   Discharge Instructions   None    ED Prescriptions    None     Controlled Substance Prescriptions [place]  Controlled Substance Registry consulted? Not Applicable   Georgetta HaberBurky, Sayf Kerner B, NP 08/23/17 1438

## 2017-08-23 NOTE — Discharge Instructions (Addendum)
Please follow up with prescribing physician for your zoloft refill.
